# Patient Record
Sex: Female | Born: 1970 | Race: White | Hispanic: No | Marital: Married | Smoking: Never smoker
Health system: Southern US, Community
[De-identification: ages and names within clinical notes are randomized; demographics above are authoritative.]

---

## 2017-08-20 DIAGNOSIS — J0110 Acute frontal sinusitis, unspecified: Secondary | ICD-10-CM | POA: Diagnosis not present

## 2017-08-20 DIAGNOSIS — G2581 Restless legs syndrome: Secondary | ICD-10-CM | POA: Diagnosis not present

## 2017-08-20 DIAGNOSIS — R51 Headache: Secondary | ICD-10-CM | POA: Diagnosis not present

## 2017-10-31 DIAGNOSIS — J069 Acute upper respiratory infection, unspecified: Secondary | ICD-10-CM | POA: Diagnosis not present

## 2017-10-31 DIAGNOSIS — R6883 Chills (without fever): Secondary | ICD-10-CM | POA: Diagnosis not present

## 2018-01-10 DIAGNOSIS — Z0000 Encounter for general adult medical examination without abnormal findings: Secondary | ICD-10-CM | POA: Diagnosis not present

## 2018-01-10 DIAGNOSIS — Z13220 Encounter for screening for lipoid disorders: Secondary | ICD-10-CM | POA: Diagnosis not present

## 2018-04-28 DIAGNOSIS — G43909 Migraine, unspecified, not intractable, without status migrainosus: Secondary | ICD-10-CM | POA: Diagnosis not present

## 2018-04-28 DIAGNOSIS — R350 Frequency of micturition: Secondary | ICD-10-CM | POA: Diagnosis not present

## 2018-05-19 ENCOUNTER — Emergency Department (HOSPITAL_COMMUNITY): Payer: 59

## 2018-05-19 ENCOUNTER — Emergency Department (HOSPITAL_COMMUNITY)
Admission: EM | Admit: 2018-05-19 | Discharge: 2018-05-19 | Disposition: A | Discharge status: Home / Self Care | Payer: 59 | Attending: Emergency Medicine | Admitting: Emergency Medicine

## 2018-05-19 ENCOUNTER — Encounter (HOSPITAL_COMMUNITY): Payer: Self-pay | Admitting: Emergency Medicine

## 2018-05-19 DIAGNOSIS — R0602 Shortness of breath: Secondary | ICD-10-CM | POA: Insufficient documentation

## 2018-05-19 DIAGNOSIS — Z79899 Other long term (current) drug therapy: Secondary | ICD-10-CM | POA: Insufficient documentation

## 2018-05-19 DIAGNOSIS — H81392 Other peripheral vertigo, left ear: Secondary | ICD-10-CM | POA: Diagnosis not present

## 2018-05-19 DIAGNOSIS — R51 Headache: Secondary | ICD-10-CM | POA: Diagnosis present

## 2018-05-19 DIAGNOSIS — R42 Dizziness and giddiness: Secondary | ICD-10-CM | POA: Diagnosis not present

## 2018-05-19 DIAGNOSIS — R112 Nausea with vomiting, unspecified: Secondary | ICD-10-CM | POA: Diagnosis not present

## 2018-05-19 LAB — I-STAT CHEM 8, ED
BUN: 5 mg/dL — AB (ref 6–20)
CREATININE: 0.6 mg/dL (ref 0.44–1.00)
Calcium, Ion: 1.11 mmol/L — ABNORMAL LOW (ref 1.15–1.40)
Chloride: 106 mmol/L (ref 98–111)
Glucose, Bld: 98 mg/dL (ref 70–99)
HCT: 38 % (ref 36.0–46.0)
Hemoglobin: 12.9 g/dL (ref 12.0–15.0)
Potassium: 3.4 mmol/L — ABNORMAL LOW (ref 3.5–5.1)
SODIUM: 139 mmol/L (ref 135–145)
TCO2: 24 mmol/L (ref 22–32)

## 2018-05-19 LAB — DIFFERENTIAL
BASOS ABS: 0 10*3/uL (ref 0.0–0.1)
BASOS PCT: 0 %
Eosinophils Absolute: 0.2 10*3/uL (ref 0.0–0.7)
Eosinophils Relative: 2 %
LYMPHS PCT: 25 %
Lymphs Abs: 3.1 10*3/uL (ref 0.7–4.0)
Monocytes Absolute: 0.5 10*3/uL (ref 0.1–1.0)
Monocytes Relative: 4 %
NEUTROS ABS: 8.6 10*3/uL — AB (ref 1.7–7.7)
NEUTROS PCT: 69 %

## 2018-05-19 LAB — I-STAT TROPONIN, ED
TROPONIN I, POC: 0 ng/mL (ref 0.00–0.08)
TROPONIN I, POC: 0 ng/mL (ref 0.00–0.08)

## 2018-05-19 LAB — COMPREHENSIVE METABOLIC PANEL
ALBUMIN: 4.1 g/dL (ref 3.5–5.0)
ALT: 15 U/L (ref 0–44)
ANION GAP: 10 (ref 5–15)
AST: 15 U/L (ref 15–41)
Alkaline Phosphatase: 60 U/L (ref 38–126)
BILIRUBIN TOTAL: 0.7 mg/dL (ref 0.3–1.2)
BUN: 7 mg/dL (ref 6–20)
CHLORIDE: 107 mmol/L (ref 98–111)
CO2: 24 mmol/L (ref 22–32)
Calcium: 9.2 mg/dL (ref 8.9–10.3)
Creatinine, Ser: 0.68 mg/dL (ref 0.44–1.00)
GFR calc Af Amer: >60 mL/min (ref >60)
GLUCOSE: 131 mg/dL — AB (ref 70–99)
POTASSIUM: 3.4 mmol/L — AB (ref 3.5–5.1)
Sodium: 141 mmol/L (ref 135–145)
TOTAL PROTEIN: 7.3 g/dL (ref 6.5–8.1)

## 2018-05-19 LAB — CBC
HEMATOCRIT: 39.7 % (ref 36.0–46.0)
HEMOGLOBIN: 13.2 g/dL (ref 12.0–15.0)
MCH: 29.5 pg (ref 26.0–34.0)
MCHC: 33.2 g/dL (ref 30.0–36.0)
MCV: 88.6 fL (ref 78.0–100.0)
Platelets: 473 10*3/uL — ABNORMAL HIGH (ref 150–400)
RBC: 4.48 MIL/uL (ref 3.87–5.11)
RDW: 13 % (ref 11.5–15.5)
WBC: 12.7 10*3/uL — AB (ref 4.0–10.5)

## 2018-05-19 LAB — I-STAT BETA HCG BLOOD, ED (MC, WL, AP ONLY): I-stat hCG, quantitative: <5 m[IU]/mL (ref <5)

## 2018-05-19 LAB — PROTIME-INR
INR: 0.92
PROTHROMBIN TIME: 12.2 s (ref 11.4–15.2)

## 2018-05-19 LAB — APTT: aPTT: 26 seconds (ref 24–36)

## 2018-05-19 LAB — LIPASE, BLOOD: LIPASE: 24 U/L (ref 11–51)

## 2018-05-19 LAB — ETHANOL: Alcohol, Ethyl (B): <10 mg/dL (ref <10)

## 2018-05-19 MED ORDER — ONDANSETRON 4 MG PO TBDP
4.0000 mg | ORAL_TABLET | Freq: Once | ORAL | Status: AC | PRN
  Administered 2018-05-19: 4 mg via ORAL
  Filled 2018-05-19: qty 1

## 2018-05-19 MED ORDER — MECLIZINE HCL 25 MG PO TABS: 25.0000 mg | ORAL_TABLET | Freq: Three times a day (TID) | ORAL | 0 refills | Status: AC | PRN

## 2018-05-19 MED ORDER — KETOROLAC TROMETHAMINE 30 MG/ML IJ SOLN
15.0000 mg | Freq: Once | INTRAMUSCULAR | Status: AC
  Administered 2018-05-19: 15 mg via INTRAVENOUS
  Filled 2018-05-19: qty 1

## 2018-05-19 MED ORDER — DIPHENHYDRAMINE HCL 50 MG/ML IJ SOLN
12.5000 mg | Freq: Once | INTRAMUSCULAR | Status: AC
  Administered 2018-05-19: 12.5 mg via INTRAVENOUS
  Filled 2018-05-19: qty 1

## 2018-05-19 MED ORDER — PROCHLORPERAZINE EDISYLATE 10 MG/2ML IJ SOLN
5.0000 mg | Freq: Once | INTRAMUSCULAR | Status: AC
  Administered 2018-05-19: 5 mg via INTRAVENOUS
  Filled 2018-05-19: qty 2
  Filled 2018-05-19: qty 1

## 2018-05-19 NOTE — ED Notes (Signed)
ATTN: Nsg Staff UA order clicked off by error; URINE STILL NEEDS TO BE COLLECTED!!

## 2018-05-19 NOTE — ED Notes (Signed)
Patient transported to CT 

## 2018-05-19 NOTE — ED Provider Notes (Signed)
[facility]-EMERGENCY DEPT Provider Note   CSN: [id number] Arrival date & time: 05/19/18  1533     History   Chief Complaint Chief Complaint  Patient presents with  . Headache  . Emesis  . Shortness of Breath    HPI Monica Stout is a 47 y.o. female who presents the emergency department with chief complaint of headache and dizziness.  Patient states that she was feeling "off" all day long with some sensation of head pressure.  She states that around 4 hours ago she had onset of head pressure, ringing in her ears, feeling as though she was going to pass out.  She had associated blurred vision which resolved within a minute . she states that she had numbness in both of her upper extremities and felt weak from head to toe.  She felt short of breath but denies hyperventilation.  Since that time she has had significant nausea without vomiting.  She states that her ear ringing has resolved however when she stands she feels dizzy.  She has a history of previous migraine headaches with which she frequently gets nausea and vomiting however this does not feel the same.  HPI  History reviewed. No pertinent past medical history.  There are no active problems to display for this patient.   Past Surgical History:  Procedure Laterality Date  . CESAREAN SECTION       OB History   None      Home Medications    Prior to Admission medications   Medication Sig Start Date End Date Taking? Authorizing Provider  aspirin-acetaminophen-caffeine (EXCEDRIN MIGRAINE) [phone] MG tablet Take 1 tablet by mouth every 6 (six) hours as needed for headache or migraine.   Yes [provider]  SUMAtriptan (IMITREX) 50 MG tablet Take 50 mg by mouth every 2 (two) hours as needed for migraine.  04/28/18  Yes [provider]  traZODone (DESYREL) 50 MG tablet Take 50 mg by mouth at bedtime.  04/28/18  Yes [provider]    Family History No family history on  file.  Social History Social History   Tobacco Use  . Smoking status: Never Smoker  . Smokeless tobacco: Never Used  Substance Use Topics  . Alcohol use: Not Currently  . Drug use: Not on file     Allergies   Bactrim [sulfamethoxazole-trimethoprim]; Codeine; Flagyl [metronidazole]; and Morphine and related   Review of Systems Review of Systems   Physical Exam Updated Vital Signs BP (!) 141/91 (BP Location: Left Arm)   Pulse 88   Temp 98.3 F (36.8 C) (Oral)   Resp 20   Ht 5\' 2"  (1.575 m)   Wt 93.4 kg   LMP 05/12/2018   SpO2 100%   BMI 37.68 kg/m   Physical Exam  Constitutional: She is oriented to person, place, and time. She appears well-developed and well-nourished. No distress.  HENT:  Head: Normocephalic and atraumatic.  Eyes: Pupils are equal, round, and reactive to light. Conjunctivae and EOM are normal. No scleral icterus.  Neck: Normal range of motion.  Cardiovascular: Normal rate, regular rhythm and normal heart sounds. Exam reveals no gallop and no friction rub.  No murmur heard. Pulmonary/Chest: Effort normal and breath sounds normal. No respiratory distress.  Abdominal: Soft. Bowel sounds are normal. She exhibits no distension and no mass. There is no tenderness. There is no guarding.  Neurological: She is alert and oriented to person, place, and time.  Speech is clear and goal oriented, follows  commands Major Cranial nerves without deficit, patient with greater than 2 beat left lateral nystagmus in his singular direction. no facial droop Normal strength in upper and lower extremities bilaterally including dorsiflexion and plantar flexion, strong and equal grip strength Sensation normal to light and sharp touch Moves extremities without ataxia, coordination intact Normal finger to nose and rapid alternating movements Neg romberg, no pronator drift Normal gait Normal heel-shin and balance  Hints Exam: Patient with eye saccade on the left side with  head impulse Left lateral nystagmus greater than 3 beats  (subjective nausea with eye/ head movement toward the left side) Negative test of skew  Skin: Skin is warm and dry. She is not diaphoretic.  Psychiatric: Her behavior is normal.  Nursing note and vitals reviewed.    ED Treatments / Results  Labs (all labs ordered are listed, but only abnormal results are displayed) Labs Reviewed  COMPREHENSIVE METABOLIC PANEL - Abnormal; Notable for the following components:      Result Value   Potassium 3.4 (*)    Glucose, Bld 131 (*)    All other components within normal limits  CBC - Abnormal; Notable for the following components:   WBC 12.7 (*)    Platelets 473 (*)    All other components within normal limits  DIFFERENTIAL - Abnormal; Notable for the following components:   Neutro Abs 8.6 (*)    All other components within normal limits  I-STAT CHEM 8, ED - Abnormal; Notable for the following components:   Potassium 3.4 (*)    BUN 5 (*)    Calcium, Ion 1.11 (*)    All other components within normal limits  LIPASE, BLOOD  ETHANOL  PROTIME-INR  APTT  I-STAT BETA HCG BLOOD, ED ([facility], [facility], [facility] ONLY)  I-STAT TROPONIN, ED  I-STAT TROPONIN, ED    EKG None  ED ECG REPORT   Date: 05/19/2018  Rate: 76  Rhythm: normal sinus rhythm  QRS Axis: normal  Intervals: normal  ST/T Wave abnormalities: normal  Conduction Disutrbances:none  Narrative Interpretation:   Old EKG Reviewed: none available  I Radiology No results found.  Procedures Procedures (including critical care time)  Medications Ordered in ED Medications  prochlorperazine (COMPAZINE) injection 5 mg (has no administration in time range)  diphenhydrAMINE (BENADRYL) injection 12.5 mg (has no administration in time range)  ketorolac (TORADOL) 30 MG/ML injection 15 mg (has no administration in time range)  ondansetron (ZOFRAN-ODT) disintegrating tablet 4 mg (4 mg Oral Given 05/19/18 1554)     Initial Impression /  Assessment and Plan / ED Course  I have reviewed the triage vital signs and the nursing notes.  Pertinent labs & imaging results that were available during my care of the patient were reviewed by me and considered in my medical decision making (see chart for details).  Clinical Course as of May 20 1721  Tue May 20, 2018  1709 Patient with symptoms of pre-syncope versus vertiginous symptoms.  Also with migraine-like symptoms.  Her physical examination reveals a hints test which is strongly suggestive of peripheral vertigo.  Patient was treated like a migraine headache with a negative head CT and has resolution the majority of her symptoms including head pressure, headache and nausea. The patient has minimal vertiginous symptoms when she looks toward the left or turns her head toward the left which is all highly suggestive of peripheral vertigo and I have extremely minimal suspicion for posterior circulation stroke.  I do not feel the patient needs an  MRI at this time.  She is ambulatory.  She has a normal neurologic examination, normal blood pressure and minimal risk factors for stroke and appears appropriate for discharge at this time.    [AH]    Clinical Course User Index [AH] Arthor CaptainHarris, Chett Taniguchi, PA-C      Final Clinical Impressions(s) / ED Diagnoses   Final diagnoses:  None    ED Discharge Orders    None       Arthor CaptainHarris, Gabriele Loveland, PA-C 05/20/18 1723    Lorre NickAllen, Anthony, MD 05/22/18 1109

## 2018-05-19 NOTE — ED Notes (Signed)
Pt placed on monitor.  

## 2018-05-19 NOTE — ED Notes (Signed)
Attempted IV without success x 2.

## 2018-05-19 NOTE — ED Triage Notes (Signed)
Pt reports that about hour ago while sitting at her desk at work she got dizzy, n/v, and SOB. Reports headache all morning.

## 2018-05-19 NOTE — Discharge Instructions (Addendum)
Contact a health care provider if: °You have a fever. °Your condition gets worse or you develop new symptoms. °Your family or friends notice any behavioral changes. °Your nausea or vomiting gets worse. °You have numbness or a “pins and needles” sensation. °Get help right away if: °You have difficulty speaking or moving. °You are always dizzy. °You faint. °You develop severe headaches. °You have weakness in your legs or arms. °You have changes in your hearing or vision. °You develop a stiff neck. °You develop sensitivity to light. °

## 2018-05-21 DIAGNOSIS — G43119 Migraine with aura, intractable, without status migrainosus: Secondary | ICD-10-CM | POA: Diagnosis not present

## 2018-05-21 DIAGNOSIS — L739 Follicular disorder, unspecified: Secondary | ICD-10-CM | POA: Diagnosis not present

## 2018-09-02 IMAGING — CT CT HEAD W/O CM
3 of 4 series · 15 of 47 positions shown, 18 images · non-contrast
Comparison: None.

CLINICAL DATA: Dizziness, nausea and vomiting, shortness of breath.

EXAM:
CT HEAD WITHOUT CONTRAST
TECHNIQUE: Contiguous axial images were obtained from the base of the skull
through the vertex without intravenous contrast.

[Series 2: head wo · axial · 0.44mm/px · z∈[-155,-40]mm · 9 of 29 slices shown, 12 images]
[im 3/29  brain]
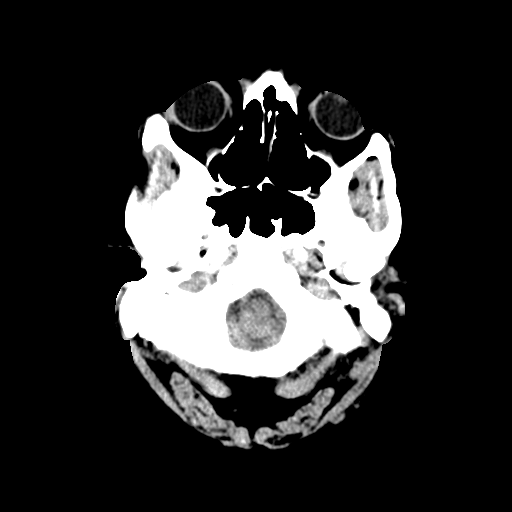
[im 3/29  bone]
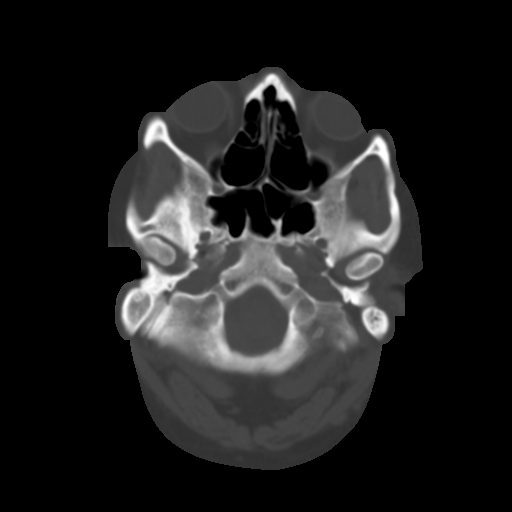
[im 6/29  brain]
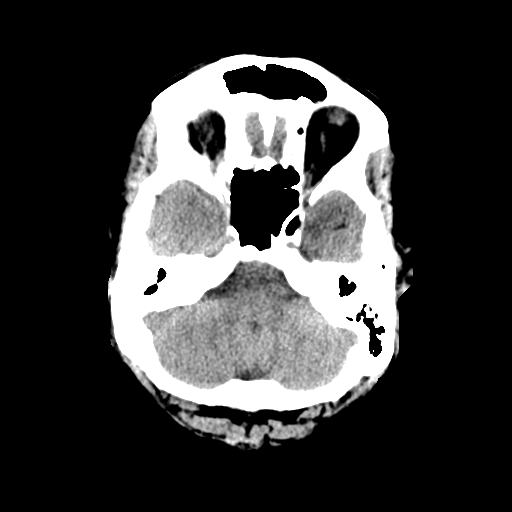
[im 9/29  brain]
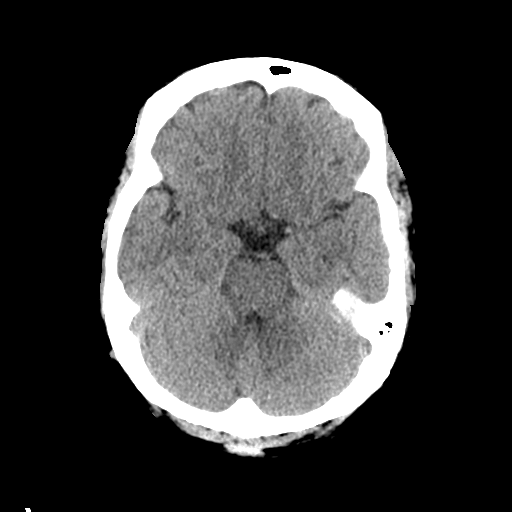
[im 12/29  brain]
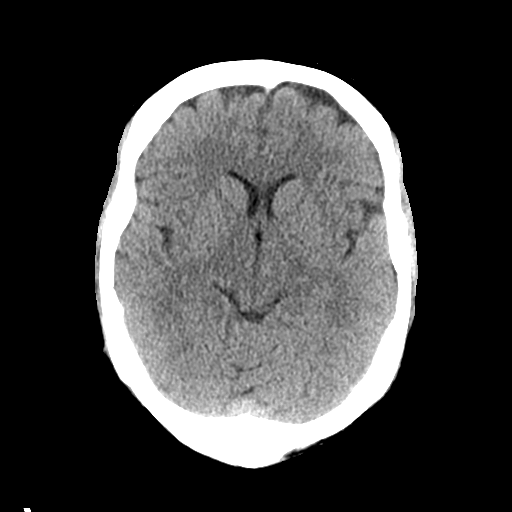
[im 15/29  brain]
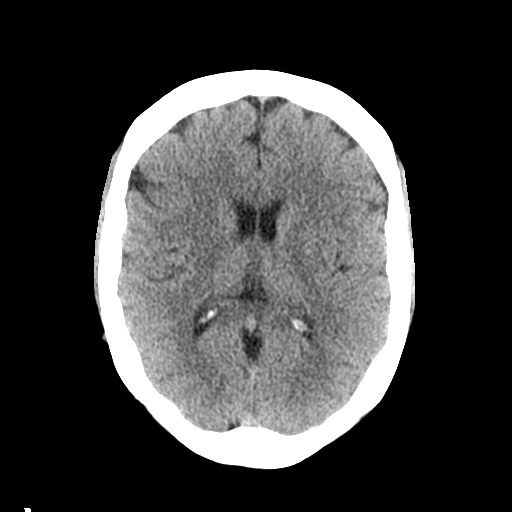
[im 15/29  bone]
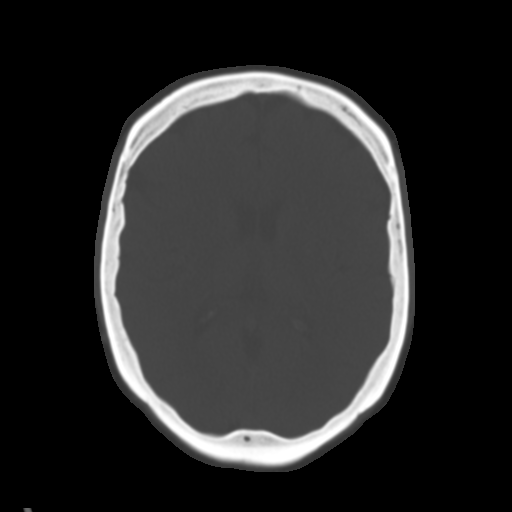
[im 17/29  brain]
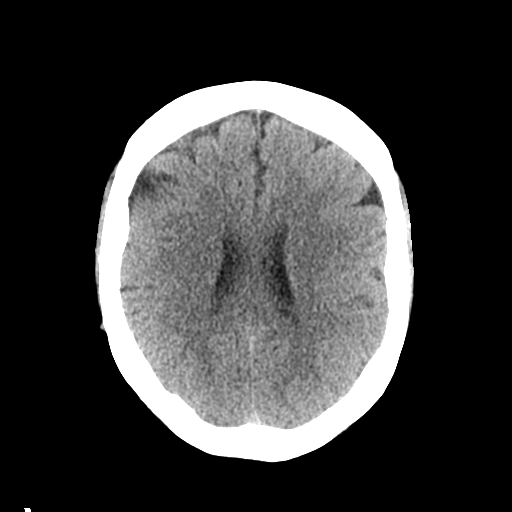
[im 20/29  brain]
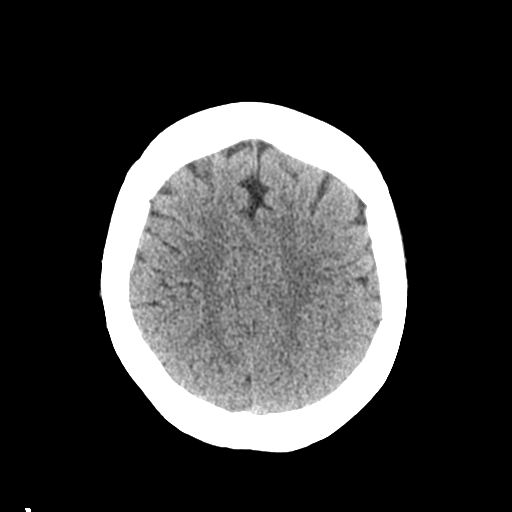
[im 23/29  brain]
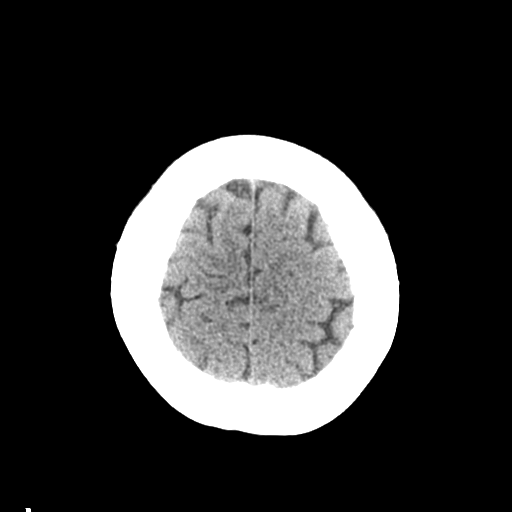
[im 26/29  brain]
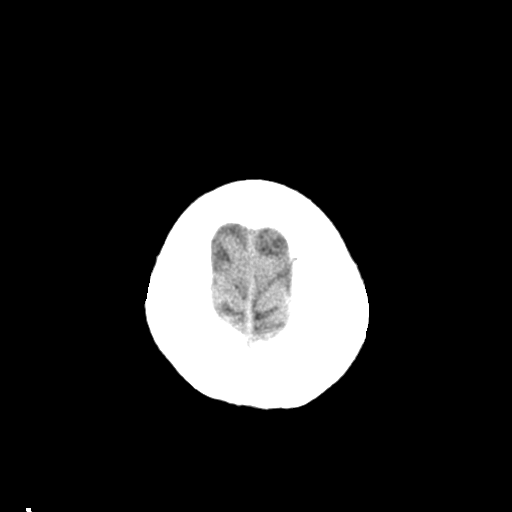
[im 26/29  bone]
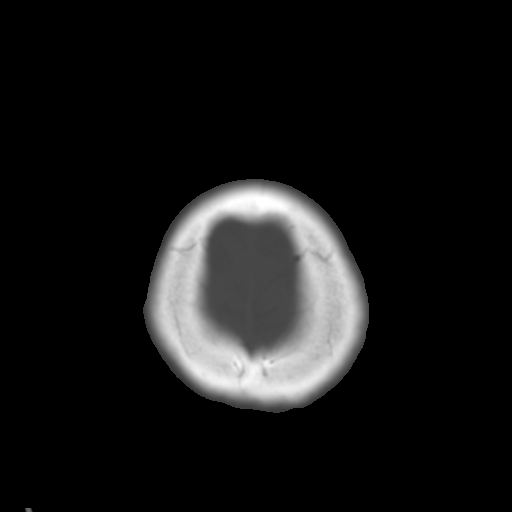

[Series 4: coronal soft tissue · coronal · 0.28mm/px · 3 of 61 slices shown]
[im 21/61  brain]
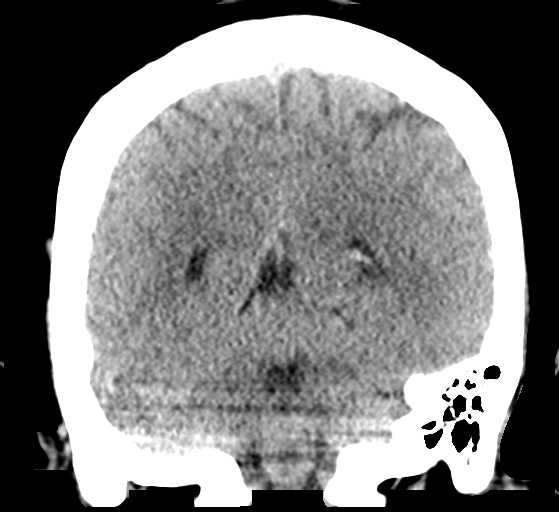
[im 27/61  brain]
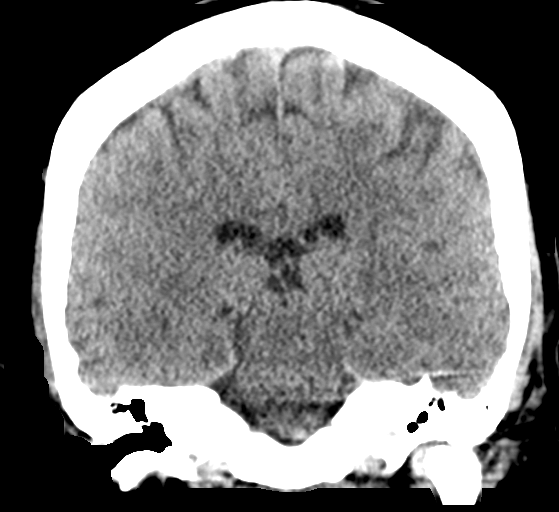
[im 34/61  brain]
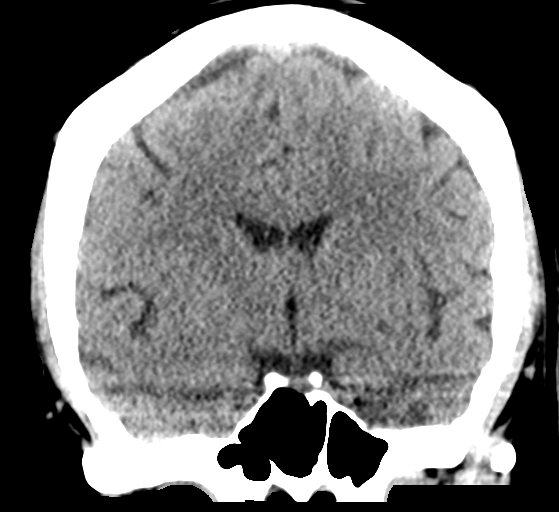

[Series 5: sagittal soft tissue · sagittal · 0.28mm/px · 3 of 48 slices shown]
[im 16/48  brain]
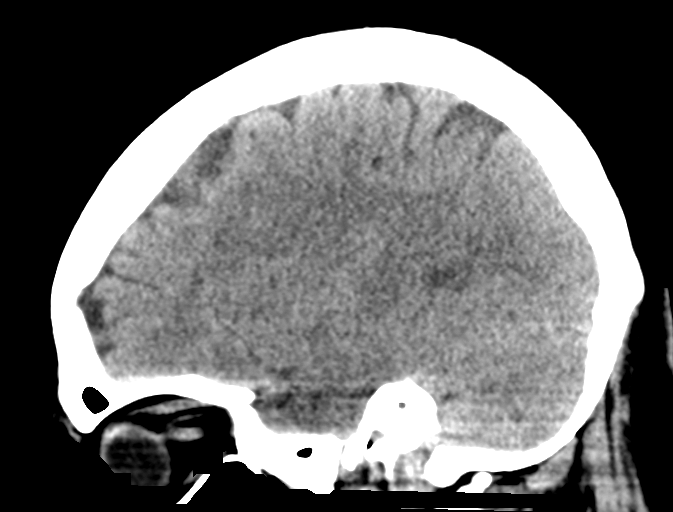
[im 24/48  brain]
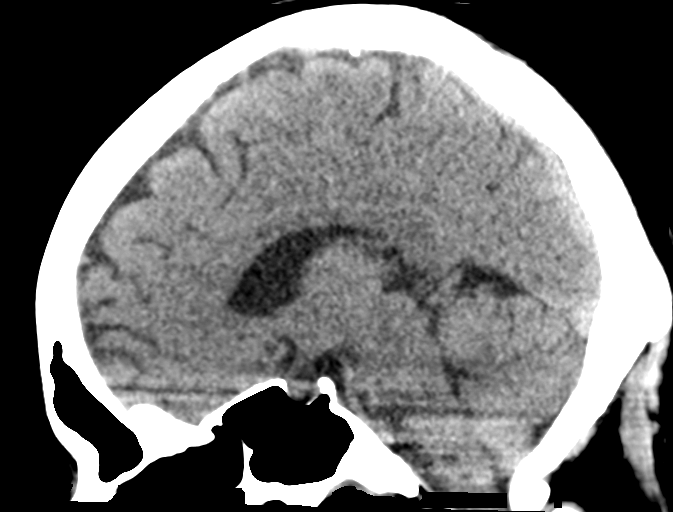
[im 32/48  brain]
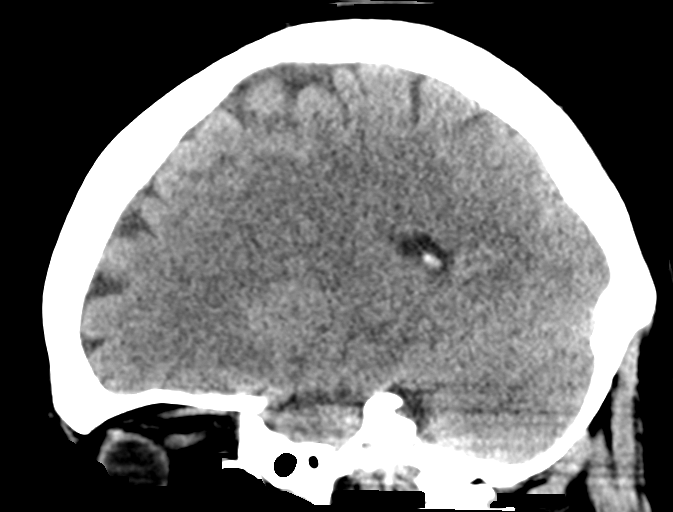

[15 of 47 positions shown; findings below may reference images not displayed]

FINDINGS: Brain: Ventricles are normal in size and configuration. All areas of
the brain demonstrate appropriate gray-white matter differentiation.
There is no mass, hemorrhage, edema or other evidence of acute
parenchymal abnormality. No extra-axial hemorrhage.

Vascular: No hyperdense vessel or unexpected calcification.

Skull: Normal. Negative for fracture or focal lesion.

Sinuses/Orbits: No acute finding.

Other: None.
IMPRESSION: Normal head CT.
